# Patient Record
Sex: Female | Born: 1940 | Hispanic: Yes | Marital: Single | Smoking: Never smoker
Health system: Southern US, Community
[De-identification: ages and names within clinical notes are randomized; demographics above are authoritative.]

## PROBLEM LIST (undated history)

## (undated) DIAGNOSIS — E7800 Pure hypercholesterolemia, unspecified: Secondary | ICD-10-CM

## (undated) DIAGNOSIS — I82409 Acute embolism and thrombosis of unspecified deep veins of unspecified lower extremity: Secondary | ICD-10-CM

## (undated) DIAGNOSIS — I2699 Other pulmonary embolism without acute cor pulmonale: Secondary | ICD-10-CM

## (undated) DIAGNOSIS — I10 Essential (primary) hypertension: Secondary | ICD-10-CM

## (undated) DIAGNOSIS — M1990 Unspecified osteoarthritis, unspecified site: Secondary | ICD-10-CM

## (undated) DIAGNOSIS — D649 Anemia, unspecified: Secondary | ICD-10-CM

## (undated) HISTORY — PX: CATARACT EXTRACTION: SUR2

---

## 2015-05-13 ENCOUNTER — Emergency Department (HOSPITAL_BASED_OUTPATIENT_CLINIC_OR_DEPARTMENT_OTHER)
Admission: EM | Admit: 2015-05-13 | Discharge: 2015-05-13 | Disposition: A | Discharge status: Home / Self Care | Payer: Medicare Other | Attending: Emergency Medicine | Admitting: Emergency Medicine

## 2015-05-13 ENCOUNTER — Encounter (HOSPITAL_BASED_OUTPATIENT_CLINIC_OR_DEPARTMENT_OTHER): Payer: Self-pay | Admitting: *Deleted

## 2015-05-13 ENCOUNTER — Emergency Department (HOSPITAL_BASED_OUTPATIENT_CLINIC_OR_DEPARTMENT_OTHER): Payer: Medicare Other

## 2015-05-13 DIAGNOSIS — Z7901 Long term (current) use of anticoagulants: Secondary | ICD-10-CM | POA: Diagnosis not present

## 2015-05-13 DIAGNOSIS — L03011 Cellulitis of right finger: Secondary | ICD-10-CM | POA: Diagnosis not present

## 2015-05-13 DIAGNOSIS — M79644 Pain in right finger(s): Secondary | ICD-10-CM | POA: Diagnosis present

## 2015-05-13 DIAGNOSIS — Z86711 Personal history of pulmonary embolism: Secondary | ICD-10-CM | POA: Insufficient documentation

## 2015-05-13 HISTORY — DX: Other pulmonary embolism without acute cor pulmonale: I26.99

## 2015-05-13 MED ORDER — TRAMADOL HCL 50 MG PO TABS
50.0000 mg | ORAL_TABLET | Freq: Once | ORAL | Status: AC
Start: 2015-05-13 — End: 2015-05-13
  Administered 2015-05-13: 50 mg via ORAL
  Filled 2015-05-13: qty 1

## 2015-05-13 MED ORDER — SULFAMETHOXAZOLE-TRIMETHOPRIM 800-160 MG PO TABS
1.0000 | ORAL_TABLET | Freq: Once | ORAL | Status: AC
  Administered 2015-05-13: 1 via ORAL
  Filled 2015-05-13: qty 1

## 2015-05-13 MED ORDER — SULFAMETHOXAZOLE-TRIMETHOPRIM 800-160 MG PO TABS: 1.0000 | ORAL_TABLET | Freq: Two times a day (BID) | ORAL | Status: AC

## 2015-05-13 MED ORDER — TRAMADOL HCL 50 MG PO TABS: 50.0000 mg | ORAL_TABLET | Freq: Four times a day (QID) | ORAL | Status: AC | PRN

## 2015-05-13 NOTE — Discharge Instructions (Signed)
Paroniquia  (Paronychia)  La paroniquia es la infección de la piel que rodea a la uña. Por lo general, se produce en las uñas de las manos, pero también puede ocurrir en las uñas de los pies. Suele causar dolor e hinchazón alrededor de la uña. Este trastorno puede aparecer de repente o desarrollarse durante un período prolongado. En algunos casos, puede acumularse pus (absceso) cerca de la uña o debajo de ella. Por lo general, la paroniquia no es grave y desaparece con un tratamiento.  CAUSAS  Este trastorno puede deberse a una infección por bacterias u hongos. Suele ser consecuencia de las bacterias Streptococcus o Staphylococcus. A menudo, las bacterias u hongos causan la infección al introducirse en el área afectada a través de una apertura en la piel, por ejemplo, un corte o un padrastro.  FACTORES DE RIESGO  Es más probable que esta afección se manifieste en:  · Las personas que se mojan las manos con frecuencia, por ejemplos, los lavaplatos, los bármanes o el personal de enfermería.  · Las personas que se comen las uñas o se chupan el pulgar.  · Las personas que se cortan demasiado las uñas.  · Las personas que tienen padrastros o lesiones en las puntas de los dedos.  · Las personas que se hacen la manicura.  · Los diabéticos.  SÍNTOMAS  Los síntomas de esta afección incluyen lo siguiente:  · Enrojecimiento e hinchazón de la piel cerca de la uña.  · Dolor alrededor de la uña al tocar el área.  · Bultos llenos de pus debajo de la cutícula. La cutícula es la piel de la base o los costados de la uña.  · Líquido o pus debajo de la uña.  · Dolor punzante en el área.  DIAGNÓSTICO  Por lo general, esta afección se diagnostica mediante un examen físico. En algunos casos, puede extraerse pus de un absceso para analizar en el laboratorio. Esto puede ayudar a determinar el tipo de bacteria u hongo que está causando la afección.  TRATAMIENTO  El tratamiento de esta afección depende de la causa y la gravedad. Si la  afección es leve, puede desaparecer sola en unos días. El médico puede recomendarle sumergir el área afectada en agua tibia un par de veces al día. Si es necesario un tratamiento, las opciones pueden incluir lo siguiente:  · Un antibiótico si la afección se debe a una infección bacteriana.  · Un antimicótico si la infección se debe a una infección por hongos.  · Incisión y drenaje si hay un absceso. En este procedimiento, el médico abrirá el absceso para drenar el pus.  INSTRUCCIONES PARA EL CUIDADO EN EL HOGAR  · Sumerja el área afectada en agua tibia si el médico se lo ha indicado. Pueden indicarle que haga esto durante 20 minutos, de 2 a 3 veces al día. Cuando el área no esté en remojo, manténgala seca.  · Tome los medicamentos solamente como se lo haya indicado el médico.  · Si le recetaron antibióticos, asegúrese de terminarlos, incluso si comienza a sentirse mejor.  · Mantenga la limpieza del área afectada.  · No trate de drenar un bulto lleno de líquido por su cuenta.  · Si lavará platos o realizará otras tareas que exijan que se moje las manos, use guantes de goma. También debe usar guantes en caso de manipular sustancias irritantes, como agentes de limpieza o sustancias químicas.  · Siga las instrucciones del médico con respecto a lo siguiente:    Cuidado de las heridas.      Cambiar y retirar la venda (vendaje).  SOLICITE ATENCIÓN MÉDICA SI:  · Los síntomas empeoran o no mejoran con el tratamiento.  · Tiene fiebre o siente escalofríos.  · Tiene enrojecimiento que se extiende desde el área afectada.  · Segrega líquido, sangre o pus, en mayor cantidad o de forma continua, del área afectada.  · Tiene el dedo o el nudillo hinchado, o le resulta difícil moverlo.     Esta información no tiene como fin reemplazar el consejo del médico. Asegúrese de hacerle al médico cualquier pregunta que tenga.     Document Released: 03/12/2005 Document Revised: 10/17/2014  Elsevier Interactive Patient Education ©2016 [employer]

## 2015-05-13 NOTE — ED Provider Notes (Signed)
CSN: [id number]     Arrival date & time 05/13/15  1845 History  By signing my name below, I, Kayla Keith, attest that this documentation has been prepared under the direction and in the presence of Rolan Bucco, MD. Electronically Signed: Jarvis Keith, ED Scribe. 05/13/2015. 9:14 PM.    Chief Complaint  Patient presents with  . Wound Check    The history is provided by the patient. No language interpreter was used.    HPI Comments: Kayla Keith is a 74 y.o. female who presents to the Emergency Department complaining of constant, moderate, gradually worsening, pain to tip of right index finger onset 1 week. Pt endorses the pain is radiating up into her hand. Pt's daughter reports pt has had associated chills, along with color change and pustule to tip of finger. Pt's daughter states the pt had a prior deformity to her finger when she was younger. Pt denies any new injury or wound to the area. She notes that pain is exacerbated with applied pressure to the finger. Pt denies any alleviating factors. She was taking Tylenol prior to arrival with no significant relief. 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 Pt tetanus status is UTD. Pt denies any fevers, nausea, vomiting or other associated symptoms.  Past Medical History  Diagnosis Date  . PE (pulmonary embolism)    Past Surgical History  Procedure Laterality Date  . Cataract extraction     No family history on file. Social History  Substance Use Topics  . Smoking status: Never Smoker   . Smokeless tobacco: Never Used  . Alcohol Use: No   OB History    No data available     Review of Systems  Constitutional: Negative for fever, chills, diaphoresis and fatigue.  HENT: Negative for congestion, rhinorrhea and sneezing.   Eyes: Negative.   Respiratory: Negative for cough, chest tightness and shortness of breath.   Cardiovascular: Negative for chest pain and leg swelling.  Gastrointestinal:  Negative for nausea, vomiting, abdominal pain, diarrhea and blood in stool.  Genitourinary: Negative for frequency, hematuria, flank pain and difficulty urinating.  Musculoskeletal: Positive for arthralgias. Negative for back pain.  Skin: Positive for color change. Negative for rash.  Neurological: Negative for dizziness, speech difficulty, weakness, numbness and headaches.      Allergies  Codeine  Home Medications   Prior to Admission medications   Medication Sig Start Date End Date Taking? Authorizing Provider  rivaroxaban (XARELTO) 20 MG TABS tablet Take 20 mg by mouth daily with supper.   Yes Historical Provider, MD  sulfamethoxazole-trimethoprim (BACTRIM DS,SEPTRA DS) 800-160 MG tablet Take 1 tablet by mouth 2 (two) times daily. 05/13/15 05/20/15  Rolan Bucco, MD  traMADol (ULTRAM) 50 MG tablet Take 1 tablet (50 mg total) by mouth every 6 (six) hours as needed. 05/13/15   Rolan Bucco, MD   Triage Vitals: BP 157/72 mmHg  Pulse 80  Temp(Src) 98.2 F (36.8 C) (Oral)  Resp 18  Ht  (1.448 m)  Wt 180 lb (81.647 kg)  BMI 38.94 kg/m2  SpO2 97%  Physical Exam  Constitutional: She is oriented to person, place, and time. She appears well-developed and well-nourished.  HENT:  Head: Normocephalic and atraumatic.  Eyes: Pupils are equal, round, and reactive to light.  Neck: Normal range of motion. Neck supple.  Cardiovascular: Normal rate, regular rhythm and normal heart sounds.   Pulmonary/Chest: Effort normal and breath sounds normal. No respiratory distress. She  has no wheezes. She has no rales. She exhibits no tenderness.  Abdominal: Soft. Bowel sounds are normal. There is no tenderness. There is no rebound and no guarding.  Musculoskeletal: Normal range of motion. She exhibits tenderness. She exhibits no edema.  Tenderness to the tip of right index finger but no pain over joint space  Lymphadenopathy:    She has no cervical adenopathy.  Neurological: She is alert and  oriented to person, place, and time.  Skin: Skin is warm and dry. No rash noted.  Right index finger, small pustule to tip of finger adjacent to nail bed and some mild surrounding swelling and erythema No streaking down the hand Chronic scarring and nail injury from prior accident as a child   Psychiatric: She has a normal mood and affect.    ED Course  Procedures (including critical care time)  DIAGNOSTIC STUDIES: Oxygen Saturation is 97% on RA, normal by my interpretation.    COORDINATION OF CARE:  INCISION AND DRAINAGE PROCEDURE NOTE: Patient identification was confirmed and verbal consent was obtained. This procedure was performed by Rolan BuccoMelanie Krystal Teachey, MD at 9:14 PM. Site: tip of right index finger Sterile procedures observed Needle size: N/A Anesthetic used (type and amt): N/A Blade size: 11 Drainage: scant purulent draiange Complexity: simple Packing used: none Area prepped with betadine.  Small pustule excised with scapel. Area cleaned, dressing applied.  Wound left open.  Pt tolerated procedure well without complications.  Instructions for care discussed verbally and pt provided with additional written instructions for homecare and f/u.  9:14 PM- Advised warm soaks and to follow up with PCP in 2 days. Recommended if swelling progresses, develops red streaks or redness spreads down hand to return to ER.  Pt advised of plan for treatment and pt agrees.    Labs Review Labs Reviewed - No data to display  Imaging Review Dg Finger Index Right  05/13/2015  CLINICAL DATA:  Pain right index finger with wound to finger tips, injured index finger 65 years ago in a corn grinder EXAM: RIGHT INDEX FINGER 2+V COMPARISON:  None. FINDINGS: Chronic deformity second distal phalanx. No acute fracture or dislocation. No periosteal reaction or cortical destruction. IMPRESSION: No acute findings. Electronically Signed   By: Esperanza Heiraymond  Rubner M.D.   On: 05/13/2015 19:38   I have personally  reviewed and evaluated these images and lab results as part of my medical decision-making.   EKG Interpretation None      MDM   Final diagnoses:  Paronychia of finger of right hand    Patient presents with a small paronychia to the tip of the right index finger. There is no evidence of bony distraction. There is no evidence of septic joint. The wound was opened in the ED. Dressings applied. She was discharged with prescriptions for Bactrim and tramadol for pain. She was advised to use warm compresses. She was advised to follow-up with her PCP in 2 days for wound check or return here if she has any worsening symptoms.  I personally performed the services described in this documentation, which was scribed in my presence.  The recorded information has been reviewed and considered.     Rolan BuccoMelanie Zarrah Loveland, MD 05/13/15 2117

## 2015-05-13 NOTE — ED Notes (Signed)
Pt reports pain in right index finger- Pt reports wound to finger tip without new injury- pain radiating up hand

## 2021-02-14 ENCOUNTER — Encounter (HOSPITAL_BASED_OUTPATIENT_CLINIC_OR_DEPARTMENT_OTHER): Payer: Self-pay | Admitting: *Deleted

## 2021-02-14 ENCOUNTER — Emergency Department (HOSPITAL_BASED_OUTPATIENT_CLINIC_OR_DEPARTMENT_OTHER): Payer: Medicare Other

## 2021-02-14 ENCOUNTER — Other Ambulatory Visit: Payer: Self-pay

## 2021-02-14 ENCOUNTER — Emergency Department (HOSPITAL_BASED_OUTPATIENT_CLINIC_OR_DEPARTMENT_OTHER)
Admission: EM | Admit: 2021-02-14 | Discharge: 2021-02-15 | Disposition: A | Discharge status: Home / Self Care | Payer: Medicare Other | Attending: Emergency Medicine | Admitting: Emergency Medicine

## 2021-02-14 DIAGNOSIS — I10 Essential (primary) hypertension: Secondary | ICD-10-CM | POA: Diagnosis not present

## 2021-02-14 DIAGNOSIS — R509 Fever, unspecified: Secondary | ICD-10-CM | POA: Diagnosis not present

## 2021-02-14 DIAGNOSIS — Z20822 Contact with and (suspected) exposure to covid-19: Secondary | ICD-10-CM | POA: Diagnosis not present

## 2021-02-14 DIAGNOSIS — R059 Cough, unspecified: Secondary | ICD-10-CM | POA: Insufficient documentation

## 2021-02-14 DIAGNOSIS — R0602 Shortness of breath: Secondary | ICD-10-CM | POA: Insufficient documentation

## 2021-02-14 DIAGNOSIS — R0789 Other chest pain: Secondary | ICD-10-CM | POA: Insufficient documentation

## 2021-02-14 DIAGNOSIS — R000 Tachycardia, unspecified: Secondary | ICD-10-CM | POA: Diagnosis not present

## 2021-02-14 HISTORY — DX: Acute embolism and thrombosis of unspecified deep veins of unspecified lower extremity: I82.409

## 2021-02-14 HISTORY — DX: Unspecified osteoarthritis, unspecified site: M19.90

## 2021-02-14 HISTORY — DX: Anemia, unspecified: D64.9

## 2021-02-14 HISTORY — DX: Pure hypercholesterolemia, unspecified: E78.00

## 2021-02-14 HISTORY — DX: Essential (primary) hypertension: I10

## 2021-02-14 LAB — URINALYSIS, ROUTINE W REFLEX MICROSCOPIC
Bilirubin Urine: NEGATIVE
Glucose, UA: NEGATIVE mg/dL
Ketones, ur: NEGATIVE mg/dL
Leukocytes,Ua: NEGATIVE
Nitrite: NEGATIVE
Protein, ur: NEGATIVE mg/dL
Specific Gravity, Urine: 1.01 (ref 1.005–1.030)
pH: 7.5 (ref 5.0–8.0)

## 2021-02-14 LAB — CBC WITH DIFFERENTIAL/PLATELET
Abs Immature Granulocytes: 0.07 10*3/uL (ref 0.00–0.07)
Basophils Absolute: 0.1 10*3/uL (ref 0.0–0.1)
Basophils Relative: 1 %
Eosinophils Absolute: 0 10*3/uL (ref 0.0–0.5)
Eosinophils Relative: 0 %
HCT: 41.2 % (ref 36.0–46.0)
Hemoglobin: 13.2 g/dL (ref 12.0–15.0)
Immature Granulocytes: 1 %
Lymphocytes Relative: 14 %
Lymphs Abs: 1.5 10*3/uL (ref 0.7–4.0)
MCH: 28.1 pg (ref 26.0–34.0)
MCHC: 32 g/dL (ref 30.0–36.0)
MCV: 87.7 fL (ref 80.0–100.0)
Monocytes Absolute: 0.9 10*3/uL (ref 0.1–1.0)
Monocytes Relative: 9 %
Neutro Abs: 8.1 10*3/uL — ABNORMAL HIGH (ref 1.7–7.7)
Neutrophils Relative %: 75 %
Platelets: 281 10*3/uL (ref 150–400)
RBC: 4.7 MIL/uL (ref 3.87–5.11)
RDW: 18.4 % — ABNORMAL HIGH (ref 11.5–15.5)
WBC: 10.7 10*3/uL — ABNORMAL HIGH (ref 4.0–10.5)
nRBC: 0 % (ref 0.0–0.2)

## 2021-02-14 LAB — COMPREHENSIVE METABOLIC PANEL
ALT: 62 U/L — ABNORMAL HIGH (ref 0–44)
AST: 51 U/L — ABNORMAL HIGH (ref 15–41)
Albumin: 4.1 g/dL (ref 3.5–5.0)
Alkaline Phosphatase: 39 U/L (ref 38–126)
Anion gap: 11 (ref 5–15)
BUN: 10 mg/dL (ref 8–23)
CO2: 26 mmol/L (ref 22–32)
Calcium: 9.3 mg/dL (ref 8.9–10.3)
Chloride: 98 mmol/L (ref 98–111)
Creatinine, Ser: 0.72 mg/dL (ref 0.44–1.00)
GFR, Estimated: >60 mL/min (ref >60)
Glucose, Bld: 137 mg/dL — ABNORMAL HIGH (ref 70–99)
Potassium: 3.6 mmol/L (ref 3.5–5.1)
Sodium: 135 mmol/L (ref 135–145)
Total Bilirubin: 0.8 mg/dL (ref 0.3–1.2)
Total Protein: 8 g/dL (ref 6.5–8.1)

## 2021-02-14 LAB — URINALYSIS, MICROSCOPIC (REFLEX)

## 2021-02-14 LAB — TROPONIN I (HIGH SENSITIVITY): Troponin I (High Sensitivity): 6 ng/L (ref <18)

## 2021-02-14 LAB — LIPASE, BLOOD: Lipase: 48 U/L (ref 11–51)

## 2021-02-14 LAB — RESP PANEL BY RT-PCR (FLU A&B, COVID) ARPGX2
Influenza A by PCR: NEGATIVE
Influenza B by PCR: NEGATIVE
SARS Coronavirus 2 by RT PCR: NEGATIVE

## 2021-02-14 MED ORDER — ACETAMINOPHEN 325 MG PO TABS
650.0000 mg | ORAL_TABLET | Freq: Once | ORAL | Status: AC | PRN
  Administered 2021-02-14: 650 mg via ORAL
  Filled 2021-02-14: qty 2

## 2021-02-14 MED ORDER — IOHEXOL 350 MG/ML SOLN
100.0000 mL | Freq: Once | INTRAVENOUS | Status: AC | PRN
  Administered 2021-02-15: 100 mL via INTRAVENOUS

## 2021-02-14 MED ORDER — SODIUM CHLORIDE 0.9 % IV BOLUS
1000.0000 mL | Freq: Once | INTRAVENOUS | Status: AC
  Administered 2021-02-14: 1000 mL via INTRAVENOUS

## 2021-02-14 MED ORDER — SODIUM CHLORIDE 0.9 % IV BOLUS
500.0000 mL | Freq: Once | INTRAVENOUS | Status: AC
  Administered 2021-02-14: 500 mL via INTRAVENOUS

## 2021-02-14 NOTE — ED Provider Notes (Signed)
Emergency Department Provider Note   I have reviewed the triage vital signs and the nursing notes.   HISTORY  Chief Complaint Fever   HPI Kayla Keith is a 80 y.o. female with PMH reviewed below presents to the ED with fever, SOB, cough, and chest tightness starting today. Patient describes a central chest pressure with no modifying factors. No abdominal pain, vomiting, or diarrhea. The patient's daughter is at bedside and notes that when she started to feel bad she took her BP and found it to be high so gave BP meds but then noticed the fever. When this developed they presented to the ED. She is compliant with her home medications including her anticoagulation.    Past Medical History:  Diagnosis Date   Anemia    Arthritis    DVT (deep venous thrombosis) (HCC)    High cholesterol    Hypertension    PE (pulmonary embolism)     There are no problems to display for this patient.   Past Surgical History:  Procedure Laterality Date   CATARACT EXTRACTION      Allergies Codeine  No family history on file.  Social History Social History   Tobacco Use   Smoking status: Never   Smokeless tobacco: Never  Substance Use Topics   Alcohol use: No   Drug use: No    Review of Systems  Constitutional: Positive fever/chills Eyes: No visual changes. ENT: Mild sore throat. Cardiovascular: Positive chest pain. Respiratory: Positive shortness of breath. Gastrointestinal: No abdominal pain.  No nausea, no vomiting.  No diarrhea.  No constipation. Genitourinary: Negative for dysuria. Musculoskeletal: Negative for back pain. Positive body aches.  Skin: Negative for rash. Neurological: Negative for focal weakness or numbness. Positive mild HA.   10-point ROS otherwise negative.  ____________________________________________   PHYSICAL EXAM:  VITAL SIGNS: ED Triage Vitals  Enc Vitals Group     BP 02/14/21 2127 (!) 183/104     Pulse Rate 02/14/21 2127 (!) 137      Resp 02/14/21 2127 20     Temp 02/14/21 2127 (!) 102.9 F (39.4 C)     Temp Source 02/14/21 2127 Oral     SpO2 02/14/21 2127 94 %     Weight 02/14/21 2127 145 lb (65.8 kg)     Height 02/14/21 2127 4\' 9"  (1.448 m)   Constitutional: Alert and oriented. Well appearing and in no acute distress. Eyes: Conjunctivae are normal.  Head: Atraumatic. Nose: No congestion/rhinnorhea. Mouth/Throat: Mucous membranes are slightly dry.  Neck: No stridor.   Cardiovascular: Tachycardia. Good peripheral circulation. Grossly normal heart sounds.   Respiratory: Normal respiratory effort.  No retractions. Lungs CTAB. Gastrointestinal: Soft and nontender. No distention.  Musculoskeletal: No lower extremity tenderness nor edema. No gross deformities of extremities. Neurologic:  Normal speech and language. No gross focal neurologic deficits are appreciated.  Skin:  Skin is warm, dry and intact. No rash noted.   ____________________________________________   LABS (all labs ordered are listed, but only abnormal results are displayed)  Labs Reviewed  COMPREHENSIVE METABOLIC PANEL - Abnormal; Notable for the following components:      Result Value   Glucose, Bld 137 (*)    AST 51 (*)    ALT 62 (*)    All other components within normal limits  CBC WITH DIFFERENTIAL/PLATELET - Abnormal; Notable for the following components:   WBC 10.7 (*)    RDW 18.4 (*)    Neutro Abs 8.1 (*)    All other  components within normal limits  URINALYSIS, ROUTINE W REFLEX MICROSCOPIC - Abnormal; Notable for the following components:   Hgb urine dipstick SMALL (*)    All other components within normal limits  URINALYSIS, MICROSCOPIC (REFLEX) - Abnormal; Notable for the following components:   Bacteria, UA RARE (*)    All other components within normal limits  RESP PANEL BY RT-PCR (FLU A&B, COVID) ARPGX2  CULTURE, BLOOD (ROUTINE X 2)  URINE CULTURE  LIPASE, BLOOD  LACTIC ACID, PLASMA  TROPONIN I (HIGH SENSITIVITY)   TROPONIN I (HIGH SENSITIVITY)   ____________________________________________  EKG   EKG Interpretation  Date/Time:  Thursday February 14 2021 21:32:52 EDT Ventricular Rate:  136 PR Interval:  150 QRS Duration: 70 QT Interval:  276 QTC Calculation: 415 R Axis:   101 Text Interpretation: Sinus tachycardia with occasional Premature ventricular complexes Rightward axis ST changes likely rate related Abnormal ECG No old tracing to compare Confirmed by Alona Bene [phone]) on 02/14/2021 9:39:10 PM        ____________________________________________  RADIOLOGY  CT CHEST ABDOMEN PELVIS W CONTRAST  Result Date: 02/15/2021 CLINICAL DATA:  Shortness of breath.  Fever. EXAM: CT CHEST, ABDOMEN, AND PELVIS WITH CONTRAST TECHNIQUE: Multidetector CT imaging of the chest, abdomen and pelvis was performed following the standard protocol during bolus administration of intravenous contrast. CONTRAST:  OMNIPAQUE IOHEXOL 350 MG/ML SOLN COMPARISON:  Chest radiograph dated 02/14/2021. FINDINGS: CT CHEST FINDINGS Cardiovascular: Borderline cardiomegaly. No pericardial effusion. Three-vessel coronary vascular calcification. Moderate atherosclerotic calcification of the thoracic aorta. No aneurysmal dilatation or dissection. Origins of the great vessels of the aortic arch appear patent as visualized. The central pulmonary arteries are unremarkable. Mediastinum/Nodes: No hilar or mediastinal adenopathy the esophagus and the thyroid gland are grossly unremarkable. No mediastinal fluid collection. Lungs/Pleura: Mild diffuse chronic interstitial coarsening or possibly sequela of prior atypical infection. Evaluation of the lungs is limited due to respiratory motion artifact. There is a 6 mm right middle lobe nodule. No focal consolidation, pleural effusion, pneumothorax. The central airways are patent. Musculoskeletal: Degenerative changes the spine. No acute osseous pathology. A 2.7 x 2.1 cm calcified left breast  mass. Correlation with clinical exam recommended. CT ABDOMEN PELVIS FINDINGS Intra-abdominal free air or free fluid. Hepatobiliary: Mild fatty liver. No intrahepatic biliary ductal dilatation. The gallbladder is unremarkable Pancreas: Unremarkable. No pancreatic ductal dilatation or surrounding inflammatory changes. Spleen: Normal in size without focal abnormality. Adrenals/Urinary Tract: The adrenal glands unremarkable. There is no hydronephrosis on either side. There is symmetric enhancement and excretion of contrast by both kidneys. Bilateral renal cysts measure up to 2.5 cm in the left kidney. The visualized ureters and urinary bladder appear unremarkable. Stomach/Bowel: There is sigmoid diverticulosis without active inflammatory changes. Is no bowel obstruction or active inflammation. The appendix is not visualized with certainty. No inflammatory changes identified in the right lower quadrant. 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 The IVC is unremarkable. No portal venous gas. There is no adenopathy. Reproductive: The uterus is anteverted and grossly unremarkable. There is a 3 cm right ovarian cyst. No follow-up imaging recommended. Note: This recommendation does not apply to premenarchal patients and to those with increased risk (genetic, family history, elevated tumor markers or other high-risk factors) of ovarian cancer. Reference: JACR 2020 Feb; 17(2):248-254 Other: Small fat containing umbilical hernia.  No fluid collection. Musculoskeletal: Osteopenia with degenerative changes of the spine. No acute osseous pathology. IMPRESSION:  1. No acute intrathoracic, abdominal, or pelvic pathology. 2. Sigmoid diverticulosis. No bowel obstruction. 3. A 6 mm right middle lobe nodule. Non-contrast chest CT at 6-12 months is recommended. If the nodule is stable at time of repeat CT,  then future CT at 18-24 months (from today's scan) is considered optional for low-risk patients, but is recommended for high-risk patients. This recommendation follows the consensus statement: Guidelines for Management of Incidental Pulmonary Nodules Detected on CT Images: From the Fleischner Society 2017; Radiology 2017; 284:228-243. 4. Calcified left breast mass. Correlation with clinical exam recommended. 5. Aortic Atherosclerosis (ICD10-I70.0). Electronically Signed   By: Elgie Collard M.D.   On: 02/15/2021 00:41   DG Chest Port 1 View  Result Date: 02/14/2021 CLINICAL DATA:  Fever, headache and chest soreness. EXAM: PORTABLE CHEST 1 VIEW COMPARISON:  None. FINDINGS: Mildly decreased lung volumes are seen with elevation of the right hemidiaphragm. Mild, diffuse, chronic appearing increased lung markings are noted. There is no evidence of acute infiltrate, pleural effusion or pneumothorax. The cardiac silhouette is mildly enlarged. There is marked severity calcification of the aortic arch. The visualized skeletal structures are unremarkable. IMPRESSION: No active cardiopulmonary disease. Electronically Signed   By: Aram Candela M.D.   On: 02/14/2021 22:05    ____________________________________________   PROCEDURES  Procedure(s) performed:   Procedures  None ____________________________________________   INITIAL IMPRESSION / ASSESSMENT AND PLAN / ED COURSE  Pertinent labs & imaging results that were available during my care of the patient were reviewed by me and considered in my medical decision making (see chart for details).   Patient presents emergency department mainly with URI symptoms and fever.  She has some associated chest discomfort shortness of breath but lower suspicion overall for ACS or PE.  Patient is anticoagulated and compliant with his medication.  She has tachycardia but in the setting of fever of almost 103 F.  Patient is hypertensive as well.  No confusion,  somnolence, other focal neurodeficits to suspect acute hypertensive emergency. Plan for labs, COVID testing, and CXR.   No clear infection source identified. Lactate normal. Vitals improving with IVF and temp reduction. CT pending and case transferred to Dr. Read Drivers. No clear sepsis evidence at this time.  ____________________________________________  FINAL CLINICAL IMPRESSION(S) / ED DIAGNOSES  Final diagnoses:  SOB (shortness of breath)  Fever in adult     MEDICATIONS GIVEN DURING THIS VISIT:  Medications  acetaminophen (TYLENOL) tablet 650 mg (650 mg Oral Given 02/14/21 2140)  sodium chloride 0.9 % bolus 500 mL (0 mLs Intravenous Stopped 02/14/21 2343)  sodium chloride 0.9 % bolus 1,000 mL ( Intravenous Stopped 02/14/21 2350)  iohexol (OMNIPAQUE) 350 MG/ML injection 100 mL (100 mLs Intravenous Contrast Given 02/15/21 0002)  naproxen (NAPROSYN) tablet 500 mg (500 mg Oral Given 02/15/21 0127)      Note:  This document was prepared using Dragon voice recognition software and may include unintentional dictation errors.  Alona Bene, MD, [facility] Emergency Medicine    Deane Wattenbarger, Arlyss Repress, MD 02/15/21 [phone]

## 2021-02-14 NOTE — ED Triage Notes (Signed)
Fever, headache, soreness in her chest. Denies cough. No fever reducer. HTN. EKG at triage.

## 2021-02-14 NOTE — ED Notes (Signed)
Pt. Daughter at bedside to report that Pt. Who speaks spanish is here due to feeling tired and weak for the past few days.  Pt. Has been sleeping quite a bit and feeling hot and cold.   Today Pt. Is running a fever with still feeling weak.

## 2021-02-15 DIAGNOSIS — R0602 Shortness of breath: Secondary | ICD-10-CM | POA: Diagnosis not present

## 2021-02-15 LAB — LACTIC ACID, PLASMA: Lactic Acid, Venous: 0.8 mmol/L (ref 0.5–1.9)

## 2021-02-15 LAB — TROPONIN I (HIGH SENSITIVITY): Troponin I (High Sensitivity): 7 ng/L (ref <18)

## 2021-02-15 MED ORDER — NAPROXEN 250 MG PO TABS
500.0000 mg | ORAL_TABLET | Freq: Once | ORAL | Status: AC
  Administered 2021-02-15: 500 mg via ORAL
  Filled 2021-02-15: qty 2

## 2021-02-15 NOTE — ED Notes (Signed)
Pt. Has been up to restroom several times walking and has had no difficulty with the walking.

## 2021-02-15 NOTE — ED Provider Notes (Signed)
Nursing notes and vitals signs, including pulse oximetry, reviewed.  Summary of this visit's results, reviewed by myself:  EKG:  EKG Interpretation  Date/Time:  Thursday February 14 2021 21:32:52 EDT Ventricular Rate:  136 PR Interval:  150 QRS Duration: 70 QT Interval:  276 QTC Calculation: 415 R Axis:   101 Text Interpretation: Sinus tachycardia with occasional Premature ventricular complexes Rightward axis ST changes likely rate related Abnormal ECG No old tracing to compare Confirmed by Alona Bene [phone]) on 02/14/2021 9:39:10 PM        Labs:  Results for orders placed or performed during the hospital encounter of 02/14/21 (from the past 24 hour(s))  Comprehensive metabolic panel     Status: Abnormal   Collection Time: 02/14/21  9:55 PM  Result Value Ref Range   Sodium 135 135 - 145 mmol/L   Potassium 3.6 3.5 - 5.1 mmol/L   Chloride 98 98 - 111 mmol/L   CO2 26 22 - 32 mmol/L   Glucose, Bld 137 (H) 70 - 99 mg/dL   BUN 10 8 - 23 mg/dL   Creatinine, Ser 4.16 0.44 - 1.00 mg/dL   Calcium 9.3 8.9 - 60.6 mg/dL   Total Protein 8.0 6.5 - 8.1 g/dL   Albumin 4.1 3.5 - 5.0 g/dL   AST 51 (H) 15 - 41 U/L   ALT 62 (H) 0 - 44 U/L   Alkaline Phosphatase 39 38 - 126 U/L   Total Bilirubin 0.8 0.3 - 1.2 mg/dL   GFR, Estimated >30 >16 mL/min   Anion gap 11 5 - 15  Lipase, blood     Status: None   Collection Time: 02/14/21  9:55 PM  Result Value Ref Range   Lipase 48 11 - 51 U/L  Troponin I (High Sensitivity)     Status: None   Collection Time: 02/14/21  9:55 PM  Result Value Ref Range   Troponin I (High Sensitivity) 6 <18 ng/L  CBC with Differential     Status: Abnormal   Collection Time: 02/14/21  9:55 PM  Result Value Ref Range   WBC 10.7 (H) 4.0 - 10.5 K/uL   RBC 4.70 3.87 - 5.11 MIL/uL   Hemoglobin 13.2 12.0 - 15.0 g/dL   HCT 01.0 93.2 - 35.5 %   MCV 87.7 80.0 - 100.0 fL   MCH 28.1 26.0 - 34.0 pg   MCHC 32.0 30.0 - 36.0 g/dL   RDW 73.2 (H) 20.2 - 54.2 %   Platelets 281  150 - 400 K/uL   nRBC 0.0 0.0 - 0.2 %   Neutrophils Relative % 75 %   Neutro Abs 8.1 (H) 1.7 - 7.7 K/uL   Lymphocytes Relative 14 %   Lymphs Abs 1.5 0.7 - 4.0 K/uL   Monocytes Relative 9 %   Monocytes Absolute 0.9 0.1 - 1.0 K/uL   Eosinophils Relative 0 %   Eosinophils Absolute 0.0 0.0 - 0.5 K/uL   Basophils Relative 1 %   Basophils Absolute 0.1 0.0 - 0.1 K/uL   Immature Granulocytes 1 %   Abs Immature Granulocytes 0.07 0.00 - 0.07 K/uL  Urinalysis, Routine w reflex microscopic Urine, Clean Catch     Status: Abnormal   Collection Time: 02/14/21  9:55 PM  Result Value Ref Range   Color, Urine YELLOW YELLOW   APPearance CLEAR CLEAR   Specific Gravity, Urine 1.010 1.005 - 1.030   pH 7.5 5.0 - 8.0   Glucose, UA NEGATIVE NEGATIVE mg/dL   Hgb urine dipstick  SMALL (A) NEGATIVE   Bilirubin Urine NEGATIVE NEGATIVE   Ketones, ur NEGATIVE NEGATIVE mg/dL   Protein, ur NEGATIVE NEGATIVE mg/dL   Nitrite NEGATIVE NEGATIVE   Leukocytes,Ua NEGATIVE NEGATIVE  Resp Panel by RT-PCR (Flu A&B, Covid) Nasopharyngeal Swab     Status: None   Collection Time: 02/14/21  9:55 PM   Specimen: Nasopharyngeal Swab; Nasopharyngeal(NP) swabs in vial transport medium  Result Value Ref Range   SARS Coronavirus 2 by RT PCR NEGATIVE NEGATIVE   Influenza A by PCR NEGATIVE NEGATIVE   Influenza B by PCR NEGATIVE NEGATIVE  Urinalysis, Microscopic (reflex)     Status: Abnormal   Collection Time: 02/14/21  9:55 PM  Result Value Ref Range   RBC / HPF 0-5 0 - 5 RBC/hpf   WBC, UA 0-5 0 - 5 WBC/hpf   Bacteria, UA RARE (A) NONE SEEN   Squamous Epithelial / LPF 0-5 0 - 5  Troponin I (High Sensitivity)     Status: None   Collection Time: 02/14/21 11:46 PM  Result Value Ref Range   Troponin I (High Sensitivity) 7 <18 ng/L  Lactic acid, plasma     Status: None   Collection Time: 02/14/21 11:46 PM  Result Value Ref Range   Lactic Acid, Venous 0.8 0.5 - 1.9 mmol/L    Imaging Studies: CT CHEST ABDOMEN PELVIS W  CONTRAST  Result Date: 02/15/2021 CLINICAL DATA:  Shortness of breath.  Fever. EXAM: CT CHEST, ABDOMEN, AND PELVIS WITH CONTRAST TECHNIQUE: Multidetector CT imaging of the chest, abdomen and pelvis was performed following the standard protocol during bolus administration of intravenous contrast. CONTRAST:  OMNIPAQUE IOHEXOL 350 MG/ML SOLN COMPARISON:  Chest radiograph dated 02/14/2021. FINDINGS: CT CHEST FINDINGS Cardiovascular: Borderline cardiomegaly. No pericardial effusion. Three-vessel coronary vascular calcification. Moderate atherosclerotic calcification of the thoracic aorta. No aneurysmal dilatation or dissection. Origins of the great vessels of the aortic arch appear patent as visualized. The central pulmonary arteries are unremarkable. Mediastinum/Nodes: No hilar or mediastinal adenopathy the esophagus and the thyroid gland are grossly unremarkable. No mediastinal fluid collection. Lungs/Pleura: Mild diffuse chronic interstitial coarsening or possibly sequela of prior atypical infection. Evaluation of the lungs is limited due to respiratory motion artifact. There is a 6 mm right middle lobe nodule. No focal consolidation, pleural effusion, pneumothorax. The central airways are patent. Musculoskeletal: Degenerative changes the spine. No acute osseous pathology. A 2.7 x 2.1 cm calcified left breast mass. Correlation with clinical exam recommended. CT ABDOMEN PELVIS FINDINGS Intra-abdominal free air or free fluid. Hepatobiliary: Mild fatty liver. No intrahepatic biliary ductal dilatation. The gallbladder is unremarkable Pancreas: Unremarkable. No pancreatic ductal dilatation or surrounding inflammatory changes. Spleen: Normal in size without focal abnormality. Adrenals/Urinary Tract: The adrenal glands unremarkable. There is no hydronephrosis on either side. There is symmetric enhancement and excretion of contrast by both kidneys. Bilateral renal cysts measure up to 2.5 cm in the left kidney. The  visualized ureters and urinary bladder appear unremarkable. Stomach/Bowel: There is sigmoid diverticulosis without active inflammatory changes. Is no bowel obstruction or active inflammation. The appendix is not visualized with certainty. No inflammatory changes identified in the right lower quadrant. 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 The IVC is unremarkable. No portal venous gas. There is no adenopathy. Reproductive: The uterus is anteverted and grossly unremarkable. There is a 3 cm right ovarian cyst. No follow-up imaging recommended. Note: This recommendation does not apply  to premenarchal patients and to those with increased risk (genetic, family history, elevated tumor markers or other high-risk factors) of ovarian cancer. Reference: JACR 2020 Feb; 17(2):248-254 Other: Small fat containing umbilical hernia.  No fluid collection. Musculoskeletal: Osteopenia with degenerative changes of the spine. No acute osseous pathology. IMPRESSION: 1. No acute intrathoracic, abdominal, or pelvic pathology. 2. Sigmoid diverticulosis. No bowel obstruction. 3. A 6 mm right middle lobe nodule. Non-contrast chest CT at 6-12 months is recommended. If the nodule is stable at time of repeat CT, then future CT at 18-24 months (from today's scan) is considered optional for low-risk patients, but is recommended for high-risk patients. This recommendation follows the consensus statement: Guidelines for Management of Incidental Pulmonary Nodules Detected on CT Images: From the Fleischner Society 2017; Radiology 2017; 284:228-243. 4. Calcified left breast mass. Correlation with clinical exam recommended. 5. Aortic Atherosclerosis (ICD10-I70.0). Electronically Signed   By: Elgie Collard M.D.   On: 02/15/2021 00:41   DG Chest Port 1 View  Result Date: 02/14/2021 CLINICAL DATA:  Fever, headache  and chest soreness. EXAM: PORTABLE CHEST 1 VIEW COMPARISON:  None. FINDINGS: Mildly decreased lung volumes are seen with elevation of the right hemidiaphragm. Mild, diffuse, chronic appearing increased lung markings are noted. There is no evidence of acute infiltrate, pleural effusion or pneumothorax. The cardiac silhouette is mildly enlarged. There is marked severity calcification of the aortic arch. The visualized skeletal structures are unremarkable. IMPRESSION: No active cardiopulmonary disease. Electronically Signed   By: Aram Candela M.D.   On: 02/14/2021 22:05     Patient and family advised of reassuring work-up.  The fever is likely due to a viral illness.  She was also advised of the nodule and advised to have it reevaluated in 6 to 12 months.     Kasch Borquez, Jonny Ruiz, MD 02/15/21 [phone]

## 2021-02-16 LAB — URINE CULTURE: Culture: <10000 — AB

## 2021-02-20 LAB — CULTURE, BLOOD (ROUTINE X 2): Culture: NO GROWTH

## 2022-01-24 IMAGING — CT CT CHEST-ABD-PELV W/ CM
2 of 5 series · 14 of 46 positions shown, 16 images · IV contrast (omnipaque)
Comparison: Chest radiograph dated 02/14/2021.

CLINICAL DATA: Shortness of breath.  Fever.

EXAM:
CT CHEST, ABDOMEN, AND PELVIS WITH CONTRAST
TECHNIQUE: Multidetector CT imaging of the chest, abdomen and pelvis was
performed following the standard protocol during bolus
administration of intravenous contrast.
CONTRAST:  100mL OMNIPAQUE IOHEXOL 350 MG/ML SOLN

[Series 2: axial st · axial · 0.96mm/px · z∈[+393,+918]mm · 11 of 119 slices shown, 13 images]
[im 7/119  soft-tissue]
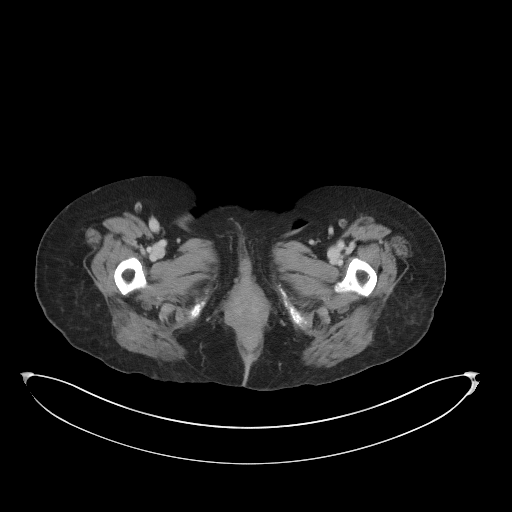
[im 7/119  bone]
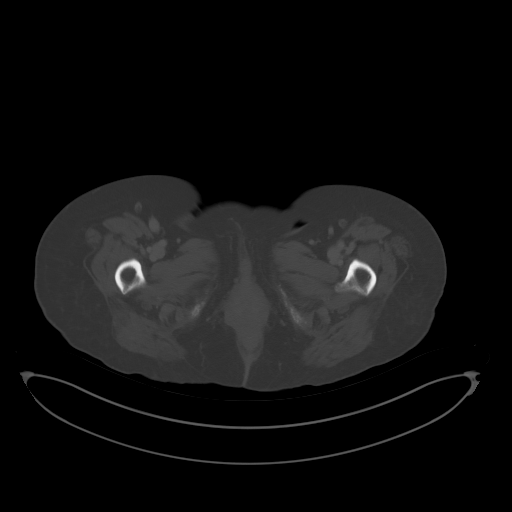
[im 20/119  soft-tissue]
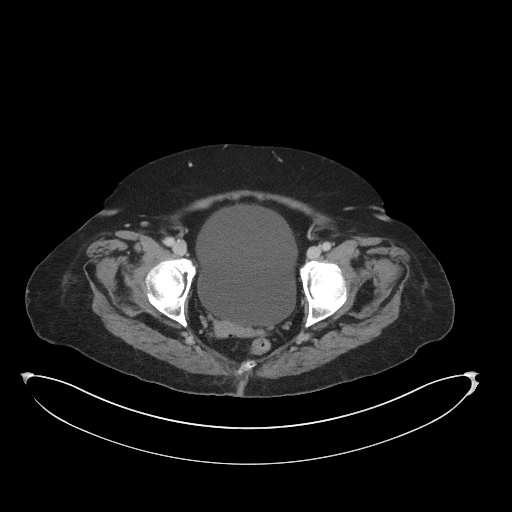
[im 27/119  soft-tissue]
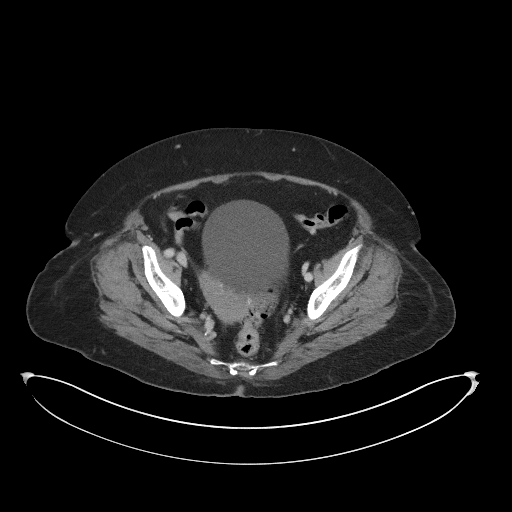
[im 40/119  soft-tissue]
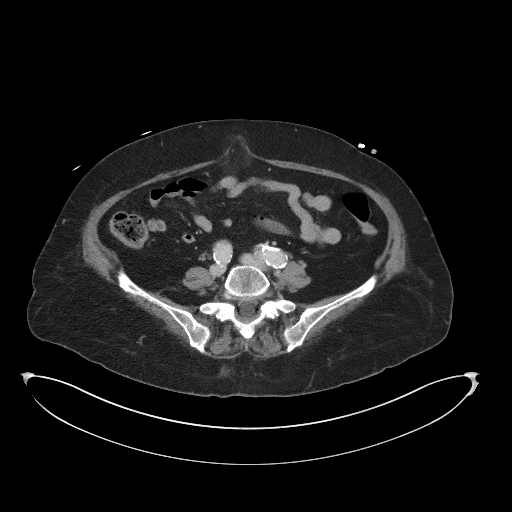
[im 46/119  soft-tissue]
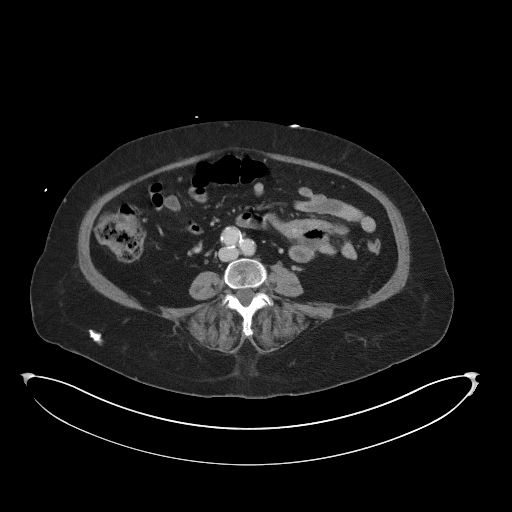
[im 60/119  soft-tissue]
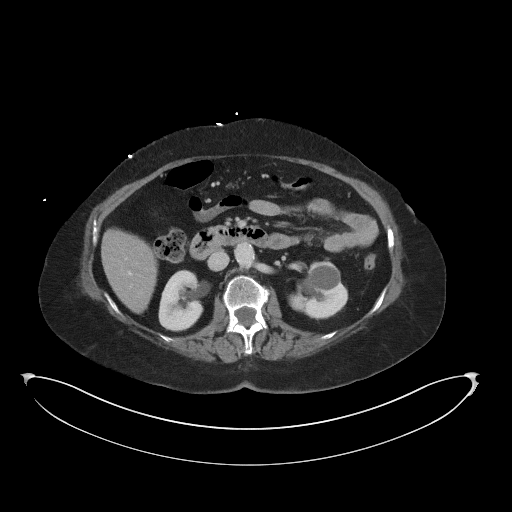
[im 73/119  soft-tissue]
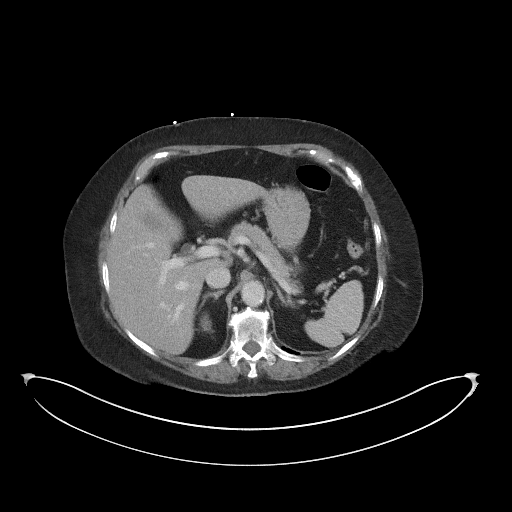
[im 79/119  soft-tissue]
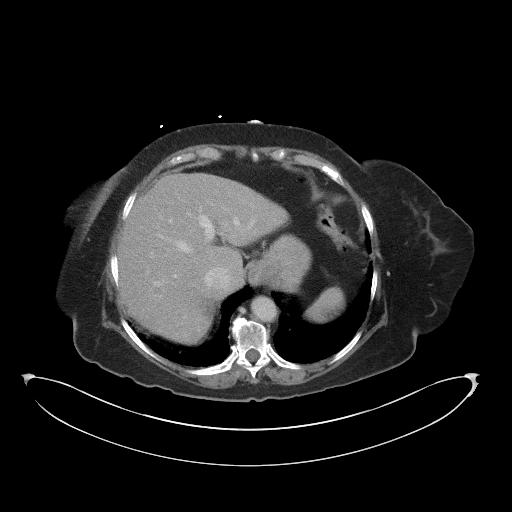
[im 92/119  soft-tissue]
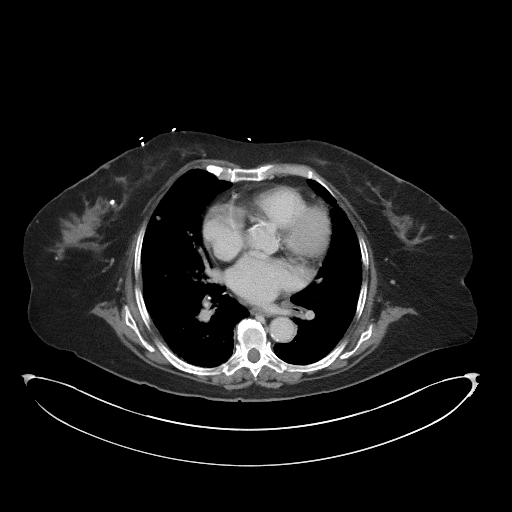
[im 92/119  bone]
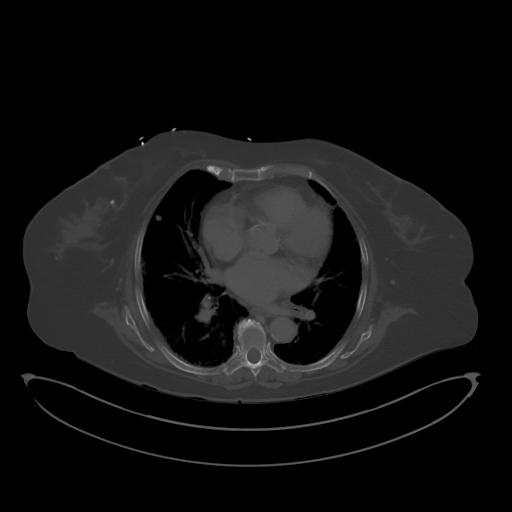
[im 99/119  soft-tissue]
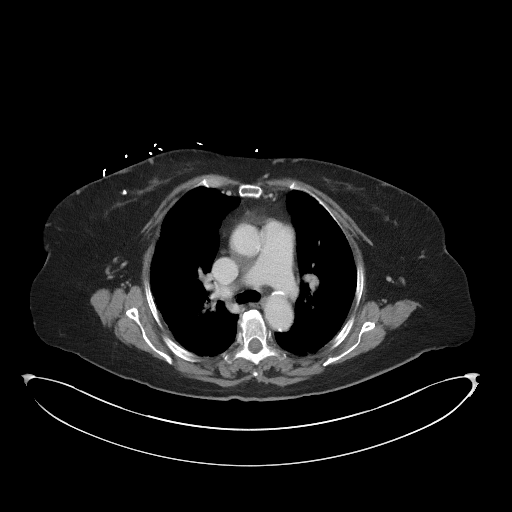
[im 112/119  soft-tissue]
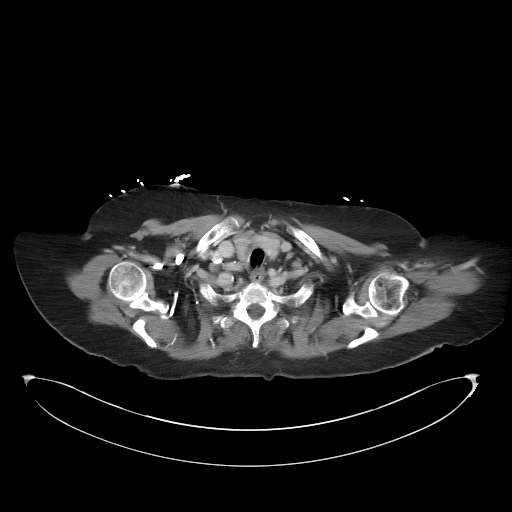

[Series 5: coronal st · coronal · 0.85mm/px · 3 of 96 slices shown]
[im 32/96  soft-tissue]
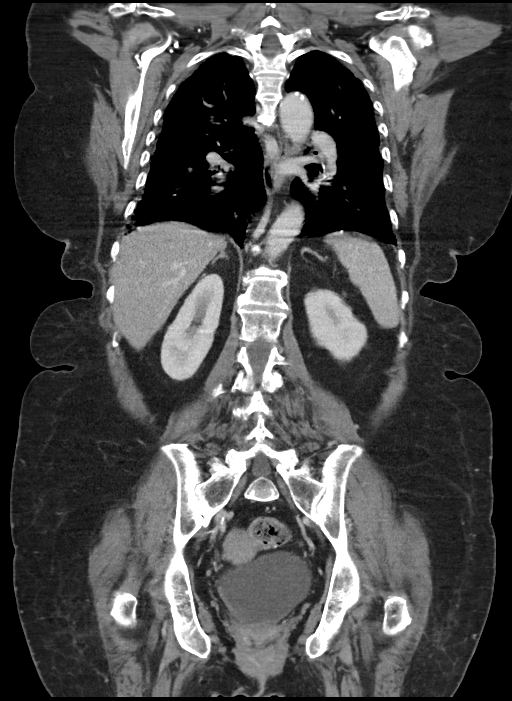
[im 43/96  soft-tissue]
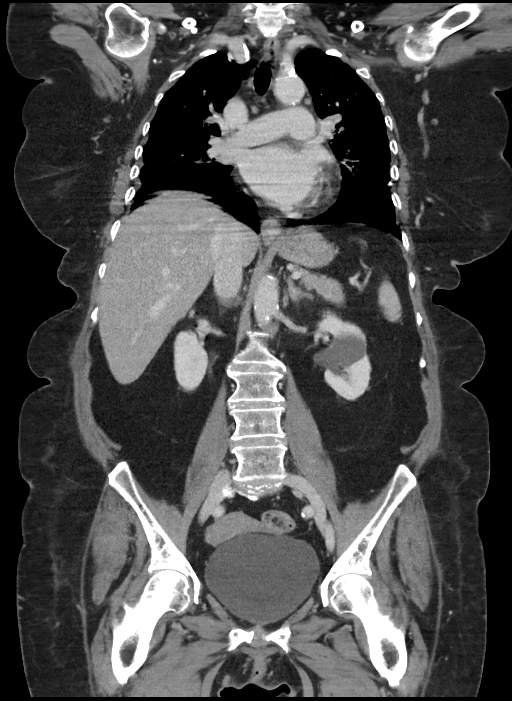
[im 53/96  soft-tissue]
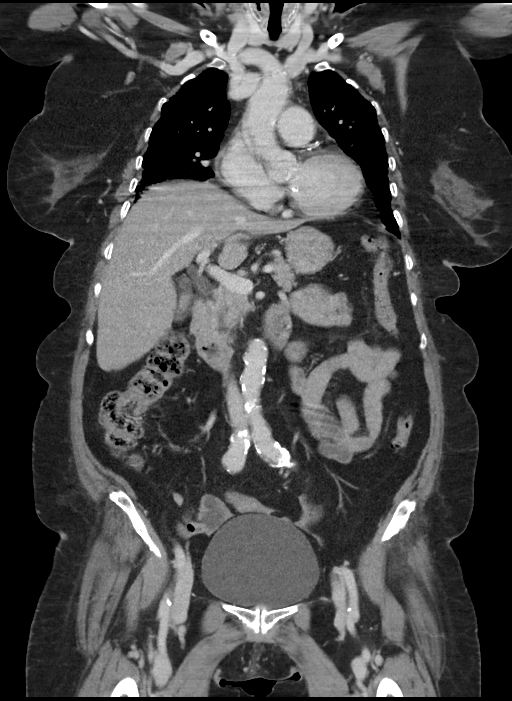

[14 of 46 positions shown; findings below may reference images not displayed]

FINDINGS: CT CHEST FINDINGS

Cardiovascular: Borderline cardiomegaly. No pericardial effusion.
Three-vessel coronary vascular calcification. Moderate
atherosclerotic calcification of the thoracic aorta. No aneurysmal
dilatation or dissection. Origins of the great vessels of the aortic
arch appear patent as visualized. The central pulmonary arteries are
unremarkable.

Mediastinum/Nodes: No hilar or mediastinal adenopathy the esophagus
and the thyroid gland are grossly unremarkable. No mediastinal fluid
collection.

Lungs/Pleura: Mild diffuse chronic interstitial coarsening or
possibly sequela of prior atypical infection. Evaluation of the
lungs is limited due to respiratory motion artifact. There is a 6 mm
right middle lobe nodule. No focal consolidation, pleural effusion,
pneumothorax. The central airways are patent.

Musculoskeletal: Degenerative changes the spine. No acute osseous
pathology. A 2.7 x 2.1 cm calcified left breast mass. Correlation
with clinical exam recommended.

CT ABDOMEN PELVIS FINDINGS

Intra-abdominal free air or free fluid.

Hepatobiliary: Mild fatty liver. No intrahepatic biliary ductal
dilatation. The gallbladder is unremarkable

Pancreas: Unremarkable. No pancreatic ductal dilatation or
surrounding inflammatory changes.

Spleen: Normal in size without focal abnormality.

Adrenals/Urinary Tract: The adrenal glands unremarkable. There is no
hydronephrosis on either side. There is symmetric enhancement and
excretion of contrast by both kidneys. Bilateral renal cysts measure
up to 2.5 cm in the left kidney. The visualized ureters and urinary
bladder appear unremarkable.

Stomach/Bowel: There is sigmoid diverticulosis without active
inflammatory changes. Is no bowel obstruction or active
inflammation. The appendix is not visualized with certainty. No
inflammatory changes identified in the right lower quadrant.

Vascular/Lymphatic: Moderate aortoiliac atherosclerotic disease.
Aneurysmal dilatation of the common iliac arteries bilaterally
measuring up to 1.9 cm in diameter on the left. The IVC is
unremarkable. No portal venous gas. There is no adenopathy.

Reproductive: The uterus is anteverted and grossly unremarkable.
There is a 3 cm right ovarian cyst. No follow-up imaging
recommended. Note: This recommendation does not apply to
premenarchal patients and to those with increased risk (genetic,
family history, elevated tumor markers or other high-risk factors)
of ovarian cancer. Reference: JACR [DATE]):248-254

Other: Small fat containing umbilical hernia.  No fluid collection.

Musculoskeletal: Osteopenia with degenerative changes of the spine.
No acute osseous pathology.
IMPRESSION: 1. No acute intrathoracic, abdominal, or pelvic pathology.
2. Sigmoid diverticulosis. No bowel obstruction.
3. A 6 mm right middle lobe nodule. Non-contrast chest CT at 6-12
months is recommended. If the nodule is stable at time of repeat CT,
then future CT at 18-24 months (from today's scan) is considered
optional for low-risk patients, but is recommended for high-risk
patients. This recommendation follows the consensus statement:
Guidelines for Management of Incidental Pulmonary Nodules Detected
[DATE].
4. Calcified left breast mass. Correlation with clinical exam
recommended.
5. Aortic Atherosclerosis ([id number]).

## 2022-09-27 ENCOUNTER — Other Ambulatory Visit: Payer: Self-pay

## 2022-09-27 ENCOUNTER — Encounter (HOSPITAL_BASED_OUTPATIENT_CLINIC_OR_DEPARTMENT_OTHER): Payer: Self-pay | Admitting: Emergency Medicine

## 2022-09-27 ENCOUNTER — Emergency Department (HOSPITAL_BASED_OUTPATIENT_CLINIC_OR_DEPARTMENT_OTHER)
Admission: EM | Admit: 2022-09-27 | Discharge: 2022-09-27 | Discharge status: Against Medical Advice | Payer: 59 | Attending: Emergency Medicine | Admitting: Emergency Medicine

## 2022-09-27 DIAGNOSIS — Z5321 Procedure and treatment not carried out due to patient leaving prior to being seen by health care provider: Secondary | ICD-10-CM | POA: Diagnosis not present

## 2022-09-27 DIAGNOSIS — K1379 Other lesions of oral mucosa: Secondary | ICD-10-CM | POA: Insufficient documentation

## 2022-09-27 NOTE — ED Triage Notes (Signed)
Patient brought in by family with c/o blisters and mouth sores x 1 week. Pt seen at urgent care and PCP for same.

## 2022-09-27 NOTE — ED Notes (Signed)
Pts family to RN station with pt, reports they have to leave due to emergency.  Pt ambulatory with steady gait, respirations equal and unlabored.

## 2024-01-15 DEATH — deceased
